# Patient Record
Sex: Female | Born: 1980 | Race: Black or African American | Hispanic: No | Marital: Single | Smoking: Never smoker
Health system: Southern US, Community
[De-identification: ages and names within clinical notes are randomized; demographics above are authoritative.]

---
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
## 2001-11-26 ENCOUNTER — Encounter: Payer: Self-pay | Admitting: Emergency Medicine

## 2001-11-26 ENCOUNTER — Emergency Department (HOSPITAL_COMMUNITY): Admission: EM | Admit: 2001-11-26 | Discharge: 2001-11-26 | Payer: Self-pay | Admitting: Emergency Medicine

## 2005-10-11 ENCOUNTER — Ambulatory Visit (HOSPITAL_COMMUNITY): Admission: RE | Admit: 2005-10-11 | Discharge: 2005-10-11 | Payer: Self-pay | Admitting: Obstetrics

## 2005-12-31 ENCOUNTER — Inpatient Hospital Stay (HOSPITAL_COMMUNITY): Admission: AD | Admit: 2005-12-31 | Discharge: 2006-01-04 | Payer: Self-pay | Admitting: Obstetrics

## 2006-01-01 ENCOUNTER — Inpatient Hospital Stay (HOSPITAL_COMMUNITY): Admission: AD | Admit: 2006-01-01 | Discharge: 2006-01-04 | Payer: Self-pay | Admitting: Obstetrics & Gynecology

## 2006-06-05 ENCOUNTER — Emergency Department (HOSPITAL_COMMUNITY): Admission: EM | Admit: 2006-06-05 | Discharge: 2006-06-05 | Payer: Self-pay | Admitting: Emergency Medicine

## 2007-11-09 ENCOUNTER — Emergency Department (HOSPITAL_COMMUNITY): Admission: EM | Admit: 2007-11-09 | Discharge: 2007-11-09 | Payer: Self-pay | Admitting: Family Medicine

## 2009-12-17 ENCOUNTER — Emergency Department (HOSPITAL_COMMUNITY): Admission: EM | Admit: 2009-12-17 | Discharge: 2009-12-17 | Payer: Self-pay | Admitting: Family Medicine

## 2009-12-19 ENCOUNTER — Emergency Department (HOSPITAL_COMMUNITY): Admission: EM | Admit: 2009-12-19 | Discharge: 2009-12-19 | Payer: Self-pay | Admitting: Emergency Medicine

## 2009-12-21 ENCOUNTER — Emergency Department (HOSPITAL_COMMUNITY): Admission: EM | Admit: 2009-12-21 | Discharge: 2009-12-21 | Payer: Self-pay | Admitting: Emergency Medicine

## 2009-12-22 ENCOUNTER — Emergency Department (HOSPITAL_COMMUNITY): Admission: EM | Admit: 2009-12-22 | Discharge: 2009-12-23 | Payer: Self-pay | Admitting: Emergency Medicine

## 2010-12-02 LAB — POCT I-STAT, CHEM 8
BUN: 15 mg/dL (ref 6–23)
BUN: 6 mg/dL (ref 6–23)
BUN: 4 mg/dL — ABNORMAL LOW (ref 6–23)
Chloride: 93 mEq/L — ABNORMAL LOW (ref 96–112)
Chloride: 97 mEq/L (ref 96–112)
Creatinine, Ser: 0.8 mg/dL (ref 0.4–1.2)
Creatinine, Ser: 0.9 mg/dL (ref 0.4–1.2)
Potassium: 3.5 mEq/L (ref 3.5–5.1)
Potassium: 3.5 mEq/L (ref 3.5–5.1)
Potassium: 3.4 mEq/L — ABNORMAL LOW (ref 3.5–5.1)
Sodium: 131 mEq/L — ABNORMAL LOW (ref 135–145)
Sodium: 138 mEq/L (ref 135–145)
Sodium: 133 mEq/L — ABNORMAL LOW (ref 135–145)

## 2010-12-02 LAB — URINALYSIS, ROUTINE W REFLEX MICROSCOPIC
Glucose, UA: NEGATIVE mg/dL
Hgb urine dipstick: NEGATIVE
Ketones, ur: 15 mg/dL — AB
Nitrite: NEGATIVE
Specific Gravity, Urine: 1.003 — ABNORMAL LOW (ref 1.005–1.030)
Specific Gravity, Urine: 1.013 (ref 1.005–1.030)
Urobilinogen, UA: 1 mg/dL (ref 0.0–1.0)
pH: 7 (ref 5.0–8.0)
pH: 7.5 (ref 5.0–8.0)

## 2010-12-02 LAB — CBC
HCT: 36.1 % (ref 36.0–46.0)
Hemoglobin: 12 g/dL (ref 12.0–15.0)
Hemoglobin: 12.2 g/dL (ref 12.0–15.0)
MCHC: 33.3 g/dL (ref 30.0–36.0)
MCV: 85.4 fL (ref 78.0–100.0)
Platelets: 317 10*3/uL (ref 150–400)
RBC: 4.22 MIL/uL (ref 3.87–5.11)
RDW: 13.2 % (ref 11.5–15.5)
RDW: 12.6 % (ref 11.5–15.5)
WBC: 7.9 10*3/uL (ref 4.0–10.5)

## 2010-12-02 LAB — DIFFERENTIAL
Basophils Absolute: 0 10*3/uL (ref 0.0–0.1)
Basophils Absolute: 0 10*3/uL (ref 0.0–0.1)
Lymphocytes Relative: 6 % — ABNORMAL LOW (ref 12–46)
Lymphocytes Relative: 38 % (ref 12–46)
Lymphs Abs: 0.5 10*3/uL — ABNORMAL LOW (ref 0.7–4.0)
Monocytes Absolute: 0.3 10*3/uL (ref 0.1–1.0)
Monocytes Absolute: 0.3 10*3/uL (ref 0.1–1.0)
Neutro Abs: 7.2 10*3/uL (ref 1.7–7.7)
Neutro Abs: 2.8 10*3/uL (ref 1.7–7.7)

## 2010-12-02 LAB — POCT URINALYSIS DIP (DEVICE)
Glucose, UA: NEGATIVE mg/dL
Hgb urine dipstick: NEGATIVE
Ketones, ur: 80 mg/dL — AB
Specific Gravity, Urine: 1.02 (ref 1.005–1.030)

## 2010-12-02 LAB — POCT PREGNANCY, URINE: Preg Test, Ur: NEGATIVE

## 2011-01-29 NOTE — Op Note (Signed)
[name], Susan Krueger           ACCOUNT NO.:  [id number]   MEDICAL RECORD NO.:  [id number]          PATIENT TYPE:  INP   LOCATION:  9102                          FACILITY:  [facility]   PHYSICIAN:  Roseanna Rainbow, M.D.DATE OF BIRTH:  [birth year]   DATE OF PROCEDURE:  01/02/2006  DATE OF DISCHARGE:                                 OPERATIVE REPORT   DELIVERY NOTE:  The patient was fully dilated and pushing.  The position was  felt to be direct OA.  The station was +4.. The vacuum was then applied,  and with one application there was crowning and subsequent delivery of the  shoulders. The delivery was effected over a midline episiotomy. The  oropharynx was suctioned with a bulb suction. Neonatology was present. The  placenta was then delivered intact with a 3-vessel cord. There was meconium  staining of the membranes. The cervix was without lacerations.  The midline  episiotomy was repaired in the usual fashion with 2-0 and 3-0 Vicryl Rapide.  The estimated blood loss was felt to be less than 500 cc.      Roseanna Rainbow, M.D.  Electronically Signed     LAJ/MEDQ  D:  01/02/2006  T:  01/03/2006  Job:  [id number]

## 2012-06-16 ENCOUNTER — Other Ambulatory Visit: Payer: Self-pay | Admitting: Infectious Diseases

## 2012-06-16 ENCOUNTER — Ambulatory Visit
Admission: RE | Admit: 2012-06-16 | Discharge: 2012-06-16 | Disposition: A | Discharge status: Home / Self Care | Payer: No Typology Code available for payment source | Source: Ambulatory Visit | Attending: Infectious Diseases | Admitting: Infectious Diseases

## 2012-06-16 DIAGNOSIS — R7612 Nonspecific reaction to cell mediated immunity measurement of gamma interferon antigen response without active tuberculosis: Secondary | ICD-10-CM

## 2013-08-07 ENCOUNTER — Emergency Department (HOSPITAL_COMMUNITY)
Admission: EM | Admit: 2013-08-07 | Discharge: 2013-08-07 | Disposition: A | Discharge status: Home / Self Care | Payer: BC Managed Care – PPO | Attending: Emergency Medicine | Admitting: Emergency Medicine

## 2013-08-07 ENCOUNTER — Encounter (HOSPITAL_COMMUNITY): Payer: Self-pay | Admitting: Emergency Medicine

## 2013-08-07 ENCOUNTER — Emergency Department (HOSPITAL_COMMUNITY): Payer: BC Managed Care – PPO

## 2013-08-07 DIAGNOSIS — Y9389 Activity, other specified: Secondary | ICD-10-CM | POA: Insufficient documentation

## 2013-08-07 DIAGNOSIS — M549 Dorsalgia, unspecified: Secondary | ICD-10-CM

## 2013-08-07 DIAGNOSIS — IMO0002 Reserved for concepts with insufficient information to code with codable children: Secondary | ICD-10-CM | POA: Insufficient documentation

## 2013-08-07 DIAGNOSIS — Y92410 Unspecified street and highway as the place of occurrence of the external cause: Secondary | ICD-10-CM | POA: Insufficient documentation

## 2013-08-07 DIAGNOSIS — Z79899 Other long term (current) drug therapy: Secondary | ICD-10-CM | POA: Insufficient documentation

## 2013-08-07 MED ORDER — IBUPROFEN 800 MG PO TABS: 800.0000 mg | ORAL_TABLET | Freq: Three times a day (TID) | ORAL | Status: DC

## 2013-08-07 MED ORDER — DIAZEPAM 5 MG PO TABS
5.0000 mg | ORAL_TABLET | Freq: Once | ORAL | Status: AC
  Administered 2013-08-07: 5 mg via ORAL
  Filled 2013-08-07: qty 1

## 2013-08-07 MED ORDER — IBUPROFEN 800 MG PO TABS
800.0000 mg | ORAL_TABLET | Freq: Once | ORAL | Status: AC
  Administered 2013-08-07: 800 mg via ORAL
  Filled 2013-08-07: qty 1
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
## 2013-08-07 NOTE — ED Provider Notes (Signed)
Medical screening examination/treatment/procedure(s) were performed by non-physician practitioner and as supervising physician I was immediately available for consultation/collaboration.  EKG Interpretation   None        Shon Baton, MD 08/07/13 1718

## 2013-08-13 ENCOUNTER — Encounter: Payer: Self-pay | Admitting: Physician Assistant

## 2013-08-13 ENCOUNTER — Ambulatory Visit (INDEPENDENT_AMBULATORY_CARE_PROVIDER_SITE_OTHER): Payer: BC Managed Care – PPO | Admitting: Physician Assistant

## 2013-08-13 VITALS — BP 112/78 | HR 84 | Temp 97.9°F | Resp 16 | Ht 67.0 in | Wt 190.0 lb

## 2013-08-13 DIAGNOSIS — N3000 Acute cystitis without hematuria: Secondary | ICD-10-CM

## 2013-08-13 DIAGNOSIS — I10 Essential (primary) hypertension: Secondary | ICD-10-CM

## 2013-08-13 DIAGNOSIS — B9689 Other specified bacterial agents as the cause of diseases classified elsewhere: Secondary | ICD-10-CM

## 2013-08-13 DIAGNOSIS — M545 Low back pain: Secondary | ICD-10-CM

## 2013-08-13 DIAGNOSIS — N760 Acute vaginitis: Secondary | ICD-10-CM

## 2013-08-13 LAB — CBC WITH DIFFERENTIAL/PLATELET
Basophils Absolute: 0 10*3/uL (ref 0.0–0.1)
Eosinophils Absolute: 0.1 10*3/uL (ref 0.0–0.7)
Lymphocytes Relative: 28 % (ref 12–46)
Lymphs Abs: 2 10*3/uL (ref 0.7–4.0)
Neutrophils Relative %: 63 % (ref 43–77)
Platelets: 436 10*3/uL — ABNORMAL HIGH (ref 150–400)
RBC: 4.57 MIL/uL (ref 3.87–5.11)
WBC: 7 10*3/uL (ref 4.0–10.5)

## 2013-08-13 LAB — BASIC METABOLIC PANEL WITH GFR
CO2: 26 mEq/L (ref 19–32)
Calcium: 9.8 mg/dL (ref 8.4–10.5)
Chloride: 102 mEq/L (ref 96–112)
GFR, Est Non African American: >89 mL/min
Sodium: 137 mEq/L (ref 135–145)

## 2013-08-13 LAB — HEPATIC FUNCTION PANEL
AST: 15 U/L (ref 0–37)
Bilirubin, Direct: 0.2 mg/dL (ref 0.0–0.3)
Total Bilirubin: 0.8 mg/dL (ref 0.3–1.2)

## 2013-08-13 MED ORDER — CYCLOBENZAPRINE HCL 10 MG PO TABS: 10.0000 mg | ORAL_TABLET | Freq: Three times a day (TID) | ORAL | Status: DC | PRN

## 2013-08-13 MED ORDER — PREDNISONE 20 MG PO TABS: ORAL_TABLET | ORAL | Status: DC

## 2013-08-13 NOTE — Patient Instructions (Signed)
Back Exercises Back exercises help treat and prevent back injuries. The goal of back exercises is to increase the strength of your abdominal and back muscles and the flexibility of your back. These exercises should be started when you no longer have back pain. Back exercises include:  Pelvic Tilt. Lie on your back with your knees bent. Tilt your pelvis until the lower part of your back is against the floor. Hold this position 5 to 10 sec and repeat 5 to 10 times.  Knee to Chest. Pull first 1 knee up against your chest and hold for 20 to 30 seconds, repeat this with the other knee, and then both knees. This may be done with the other leg straight or bent, whichever feels better.  Sit-Ups or Curl-Ups. Bend your knees 90 degrees. Start with tilting your pelvis, and do a partial, slow sit-up, lifting your trunk only 30 to 45 degrees off the floor. Take at least 2 to 3 seconds for each sit-up. Do not do sit-ups with your knees out straight. If partial sit-ups are difficult, simply do the above but with only tightening your abdominal muscles and holding it as directed.  Hip-Lift. Lie on your back with your knees flexed 90 degrees. Push down with your feet and shoulders as you raise your hips a couple inches off the floor; hold for 10 seconds, repeat 5 to 10 times.  Back arches. Lie on your stomach, propping yourself up on bent elbows. Slowly press on your hands, causing an arch in your low back. Repeat 3 to 5 times. Any initial stiffness and discomfort should lessen with repetition over time.  Shoulder-Lifts. Lie face down with arms beside your body. Keep hips and torso pressed to floor as you slowly lift your head and shoulders off the floor. Do not overdo your exercises, especially in the beginning. Exercises may cause you some mild back discomfort which lasts for a few minutes; however, if the pain is more severe, or lasts for more than 15 minutes, do not continue exercises until you see your caregiver.  Improvement with exercise therapy for back problems is slow.  See your caregivers for assistance with developing a proper back exercise program. Document Released: 10/07/2004 Document Revised: 11/22/2011 Document Reviewed: 07/01/2011 [employer] Patient Information 2014 [employer].  
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
## 2013-08-14 LAB — URINALYSIS, ROUTINE W REFLEX MICROSCOPIC
Glucose, UA: NEGATIVE mg/dL
Leukocytes, UA: NEGATIVE
Specific Gravity, Urine: 1.025 (ref 1.005–1.030)
pH: 6 (ref 5.0–8.0)

## 2013-08-14 LAB — URINE CULTURE: Colony Count: NO GROWTH

## 2013-10-25 ENCOUNTER — Ambulatory Visit (INDEPENDENT_AMBULATORY_CARE_PROVIDER_SITE_OTHER): Payer: BC Managed Care – PPO | Admitting: Physician Assistant

## 2013-10-25 ENCOUNTER — Encounter: Payer: Self-pay | Admitting: Physician Assistant

## 2013-10-25 VITALS — BP 102/62 | HR 80 | Temp 97.9°F | Resp 16 | Wt 207.0 lb

## 2013-10-25 DIAGNOSIS — N3000 Acute cystitis without hematuria: Secondary | ICD-10-CM

## 2013-10-25 DIAGNOSIS — M545 Low back pain, unspecified: Secondary | ICD-10-CM

## 2013-10-25 MED ORDER — CYCLOBENZAPRINE HCL 10 MG PO TABS: 10.0000 mg | ORAL_TABLET | Freq: Three times a day (TID) | ORAL | Status: AC | PRN

## 2013-10-25 MED ORDER — MELOXICAM 15 MG PO TABS: ORAL_TABLET | ORAL | Status: DC

## 2013-10-25 NOTE — Patient Instructions (Signed)
Back Pain, Adult °Low back pain is very common. About 1 in 5 people have back pain. The cause of low back pain is rarely dangerous. The pain often gets better over time. About half of people with a sudden onset of back pain feel better in just 2 weeks. About 8 in 10 people feel better by 6 weeks.  °CAUSES °Some common causes of back pain include: °· Strain of the muscles or ligaments supporting the spine. °· Wear and tear (degeneration) of the spinal discs. °· Arthritis. °· Direct injury to the back. °DIAGNOSIS °Most of the time, the direct cause of low back pain is not known. However, back pain can be treated effectively even when the exact cause of the pain is unknown. Answering your caregiver's questions about your overall health and symptoms is one of the most accurate ways to make sure the cause of your pain is not dangerous. If your caregiver needs more information, he or she may order lab work or imaging tests (X-rays or MRIs). However, even if imaging tests show changes in your back, this usually does not require surgery. °HOME CARE INSTRUCTIONS °For many people, back pain returns. Since low back pain is rarely dangerous, it is often a condition that people can learn to manage on their own.  °· Remain active. It is stressful on the back to sit or stand in one place. Do not sit, drive, or stand in one place for more than 30 minutes at a time. Take short walks on level surfaces as soon as pain allows. Try to increase the length of time you walk each day. °· Do not stay in bed. Resting more than 1 or 2 days can delay your recovery. °· Do not avoid exercise or work. Your body is made to move. It is not dangerous to be active, even though your back may hurt. Your back will likely heal faster if you return to being active before your pain is gone. °· Pay attention to your body when you  bend and lift. Many people have less discomfort when lifting if they bend their knees, keep the load close to their bodies, and  avoid twisting. Often, the most comfortable positions are those that put less stress on your recovering back. °· Find a comfortable position to sleep. Use a firm mattress and lie on your side with your knees slightly bent. If you lie on your back, put a pillow under your knees. °· Only take over-the-counter or prescription medicines as directed by your caregiver. Over-the-counter medicines to reduce pain and inflammation are often the most helpful. Your caregiver may prescribe muscle relaxant drugs. These medicines help dull your pain so you can more quickly return to your normal activities and healthy exercise. °· Put ice on the injured area. °· Put ice in a plastic bag. °· Place a towel between your skin and the bag. °· Leave the ice on for 15-20 minutes, 03-04 times a day for the first 2 to 3 days. After that, ice and heat may be alternated to reduce pain and spasms. °· Ask your caregiver about trying back exercises and gentle massage. This may be of some benefit. °· Avoid feeling anxious or stressed. Stress increases muscle tension and can worsen back pain. It is important to recognize when you are anxious or stressed and learn ways to manage it. Exercise is a great option. °SEEK MEDICAL CARE IF: °· You have pain that is not relieved with rest or medicine. °· You have pain that does not improve in 1 week. °· You have new symptoms. °· You are generally not feeling well. °SEEK   IMMEDIATE MEDICAL CARE IF:   You have pain that radiates from your back into your legs.  You develop new bowel or bladder control problems.  You have unusual weakness or numbness in your arms or legs.  You develop nausea or vomiting.  You develop abdominal pain.  You feel faint. Document Released: 08/30/2005 Document Revised: 02/29/2012 Document Reviewed: 01/18/2011 [facility] Patient Information 2014 [place], [place]. Back Injury Prevention Back injuries can be extremely painful and difficult to heal. After having one back  injury, you are much more likely to experience another later on. It is important to learn how to avoid injuring or re-injuring your back. The following tips can help you to prevent a back injury. PHYSICAL FITNESS  Exercise regularly and try to develop good tone in your abdominal muscles. Your abdominal muscles provide a lot of the support needed by your back.  Do aerobic exercises (walking, jogging, biking, swimming) regularly.  Do exercises that increase balance and strength (tai chi, yoga) regularly. This can decrease your risk of falling and injuring your back.  Stretch before and after exercising.  Maintain a healthy weight. The more you weigh, the more stress is placed on your back. For every pound of weight, 10 times that amount of pressure is placed on the back. DIET  Talk to your caregiver about how much calcium and vitamin D you need per day. These nutrients help to prevent weakening of the bones (osteoporosis). Osteoporosis can cause broken (fractured) bones that lead to back pain.  Include good sources of calcium in your diet, such as dairy products, green, leafy vegetables, and products with calcium added (fortified).  Include good sources of vitamin D in your diet, such as milk and foods that are fortified with vitamin D.  Consider taking a nutritional supplement or a multivitamin if needed.  Stop smoking if you smoke. POSTURE  Sit and stand up straight. Avoid leaning forward when you sit or hunching over when you stand.  Choose chairs with good low back (lumbar) support.  If you work at a desk, sit close to your work so you do not need to lean over. Keep your chin tucked in. Keep your neck drawn back and elbows bent at a right angle. Your arms should look like the letter "L."  Sit high and close to the steering wheel when you drive. Add a lumbar support to your car seat if needed.  Avoid sitting or standing in one position for too long. Take breaks to get up, stretch,  and walk around at least once every hour. Take breaks if you are driving for long periods of time.  Sleep on your side with your knees slightly bent, or sleep on your back with a pillow under your knees. Do not sleep on your stomach. LIFTING, TWISTING, AND REACHING  Avoid heavy lifting, especially repetitive lifting. If you must do heavy lifting:  Stretch before lifting.  Work slowly.  Rest between lifts.  Use carts and dollies to move objects when possible.  Make several small trips instead of carrying 1 heavy load.  Ask for help when you need it.  Ask for help when moving big, awkward objects.  Follow these steps when lifting:  Stand with your feet shoulder-width apart.  Get as close to the object as you can. Do not try to pick up heavy objects that are far from your body.  Use handles or lifting straps if they are available.  Bend at your knees. Squat down, but keep your  heels off the floor.  Keep your shoulders pulled back, your chin tucked in, and your back straight.  Lift the object slowly, tightening the muscles in your legs, abdomen, and buttocks. Keep the object as close to the center of your body as possible.  When you put a load down, use these same guidelines in reverse.  Do not:  Lift the object above your waist.  Twist at the waist while lifting or carrying a load. Move your feet if you need to turn, not your waist.  Bend over without bending at your knees.  Avoid reaching over your head, across a table, or for an object on a high surface. OTHER TIPS  Avoid wet floors and keep sidewalks clear of ice to prevent falls.  Do not sleep on a mattress that is too soft or too hard.  Keep items that are used frequently within easy reach.  Put heavier objects on shelves at waist level and lighter objects on lower or higher shelves.  Find ways to decrease your stress, such as exercise, massage, or relaxation techniques. Stress can build up in your muscles.  Tense muscles are more vulnerable to injury.  Seek treatment for depression or anxiety if needed. These conditions can increase your risk of developing back pain. SEEK MEDICAL CARE IF:  You injure your back.  You have questions about diet, exercise, or other ways to prevent back injuries. MAKE SURE YOU:  Understand these instructions.  Will watch your condition.  Will get help right away if you are not doing well or get worse. Document Released: 10/07/2004 Document Revised: 11/22/2011 Document Reviewed: 10/11/2011 [facility] Patient Information 2014 [place], [place].
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
## 2013-10-26 LAB — URINALYSIS, ROUTINE W REFLEX MICROSCOPIC
BILIRUBIN URINE: NEGATIVE
Glucose, UA: NEGATIVE mg/dL
Hgb urine dipstick: NEGATIVE
KETONES UR: NEGATIVE mg/dL
Nitrite: NEGATIVE
PROTEIN: NEGATIVE mg/dL
Specific Gravity, Urine: 1.02 (ref 1.005–1.030)
UROBILINOGEN UA: 0.2 mg/dL (ref 0.0–1.0)
pH: 6 (ref 5.0–8.0)

## 2013-10-26 LAB — URINALYSIS, MICROSCOPIC ONLY
BACTERIA UA: NONE SEEN
CASTS: NONE SEEN
Crystals: NONE SEEN

## 2013-10-27 LAB — URINE CULTURE
Colony Count: NO GROWTH
Organism ID, Bacteria: NO GROWTH

## 2013-11-20 ENCOUNTER — Encounter: Payer: Self-pay | Admitting: Physician Assistant

## 2014-04-29 ENCOUNTER — Emergency Department (INDEPENDENT_AMBULATORY_CARE_PROVIDER_SITE_OTHER)
Admission: EM | Admit: 2014-04-29 | Discharge: 2014-04-29 | Disposition: A | Discharge status: Home / Self Care | Payer: Self-pay | Source: Home / Self Care

## 2014-04-29 ENCOUNTER — Encounter (HOSPITAL_COMMUNITY): Payer: Self-pay | Admitting: Emergency Medicine

## 2014-04-29 DIAGNOSIS — W57XXXA Bitten or stung by nonvenomous insect and other nonvenomous arthropods, initial encounter: Secondary | ICD-10-CM

## 2014-04-29 DIAGNOSIS — T148 Other injury of unspecified body region: Secondary | ICD-10-CM

## 2014-04-29 MED ORDER — TRIAMCINOLONE ACETONIDE 0.1 % EX CREA: 1.0000 "application " | TOPICAL_CREAM | Freq: Two times a day (BID) | CUTANEOUS | Status: DC

## 2014-04-29 NOTE — Discharge Instructions (Signed)
Bedbugs °Bedbugs are tiny bugs that live in and around beds. During the day, they hide in mattresses and other places near beds. They come out at night and bite people lying in bed. They need blood to live and grow. Bedbugs can be found in beds anywhere. Usually, they are found in places where many people come and go (hotels, shelters, hospitals). It does not matter whether the place is dirty or clean. °Getting bitten by bedbugs rarely causes a medical problem. The biggest problem can be getting rid of them.  This often takes the work of a pest control expert. °CAUSES °· Less use of pesticides. Bedbugs were common before the 1950s. Then, strong pesticides such as DDT nearly wiped them out. Today, these pesticides are not used because they harm the environment and can cause health problems. °· More travel. Besides mattresses, bedbugs can also live in clothing and luggage. They can come along as people travel from place to place. Bedbugs are more common in certain parts of the world. When people travel to those areas, the bugs can come home with them. °· Presence of birds and bats. Bedbugs often infest birds and bats. If you have these animals in or near your home, bedbugs may infest your house, too. °SYMPTOMS °It does not hurt to be bitten by a bedbug. You will probably not wake up when you are bitten. Bedbugs usually bite areas of the skin that are not covered. Symptoms may show when you wake up, or they may take a day or more to show up. Symptoms may include: °· Small red bumps on the skin. These might be lined up in a row or clustered in a group. °· A darker red dot in the middle of red bumps. °· Blisters on the skin. There may be swelling and very bad itching. These may be signs of an allergic reaction. This does not happen often. °DIAGNOSIS °Bedbug bites might look and feel like other types of insect bites. The bugs do not stay on the body like ticks or lice. They bite, drop off, and crawl away to hide. Your  caregiver will probably: °· Ask about your symptoms. °· Ask about your recent activities and travel. °· Check your skin for bedbug bites. °· Ask you to check at home for signs of bedbugs. You should look for: °¨ Spots or stains on the bed or nearby. This could be from bedbugs that were crushed or from their eggs or waste. °¨ Bedbugs themselves. They are reddish-brown, oval, and flat. They do not fly. They are about the size of an apple seed. °· Places to look for bedbugs include: °¨ Beds. Check mattresses, headboards, box springs, and bed frames. °¨ On drapes and curtains near the bed. °¨ Under carpeting in the bedroom. °¨ Behind electrical outlets. °¨ Behind any wallpaper that is peeling. °¨ Inside luggage. °TREATMENT °Most bedbug bites do not need treatment. They usually go away on their own in a few days. The bites are not dangerous. However, treatment may be needed if you have scratched so much that your skin has become infected. You may also need treatment if you are allergic to bedbug bites. Treatment options include: °· A drug that stops swelling and itching (corticosteroid). Usually, a cream is rubbed on the skin. If you have a bad rash, you may be given a corticosteroid pill. °· Oral antihistamines. These are pills to help control itching. °· Antibiotic medicines. An antibiotic may be prescribed for infected skin. °HOME CARE INSTRUCTIONS  °·   Take any medicine prescribed by your caregiver for your bites. Follow the directions carefully. °· Consider wearing pajamas with long sleeves and pant legs. °· Your bedroom may need to be treated. A pest control expert should make sure the bedbugs are gone. You may need to throw away mattresses or luggage. Ask the pest control expert what you can do to keep the bedbugs from coming back. Common suggestions include: °¨ Putting a plastic cover over your mattress. °¨ Washing and drying your clothes and bedding in hot water and a hot dryer. The temperature should be hotter  than 120° F (48.9° C). Bedbugs are killed by high temperatures. °¨ Vacuuming carefully all around your bed. Vacuum in all cracks and crevices where the bugs might hide. Do this often. °¨ Carefully checking all used furniture, bedding, or clothes that you bring into your house. °¨ Eliminating bird nests and bat roosts. °· If you get bedbug bites when traveling, check all your possessions carefully before bringing them into your house. If you find any bugs on clothes or in your luggage, consider throwing those items away. °SEEK MEDICAL CARE IF: °· You have red bug bites that keep coming back. °· You have red bug bites that itch badly. °· You have bug bites that cause a skin rash. °· You have scratch marks that are red and sore. °SEEK IMMEDIATE MEDICAL CARE IF: °You have a fever. °Document Released: 10/02/2010 Document Revised: 11/22/2011 Document Reviewed: 10/02/2010 °ExitCare® Patient Information ©2015 [employer]. This information is not intended to replace advice given to you by your health care provider. Make sure you discuss any questions you have with your health care provider. °Insect Bite °Mosquitoes, flies, fleas, bedbugs, and many other insects can bite. Insect bites are different from insect stings. A sting is when venom is injected into the skin. Some insect bites can transmit infectious diseases. °SYMPTOMS  °Insect bites usually turn red, swell, and itch for 2 to 4 days. They often go away on their own. °TREATMENT  °Your caregiver may prescribe antibiotic medicines if a bacterial infection develops in the bite. °HOME CARE INSTRUCTIONS °· Do not scratch the bite area. °· Keep the bite area clean and dry. Wash the bite area thoroughly with soap and water. °· Put ice or cool compresses on the bite area. °¨ Put ice in a plastic bag. °¨ Place a towel between your skin and the bag. °¨ Leave the ice on for 20 minutes, 4 times a day for the first 2 to 3 days, or as directed. °· You may apply a baking soda  paste, cortisone cream, or calamine lotion to the bite area as directed by your caregiver. This can help reduce itching and swelling. °· Only take over-the-counter or prescription medicines as directed by your caregiver. °· If you are given antibiotics, take them as directed. Finish them even if you start to feel better. °You may need a tetanus shot if: °· You cannot remember when you had your last tetanus shot. °· You have never had a tetanus shot. °· The injury broke your skin. °If you get a tetanus shot, your arm may swell, get red, and feel warm to the touch. This is common and not a problem. If you need a tetanus shot and you choose not to have one, there is a rare chance of getting tetanus. Sickness from tetanus can be serious. °SEEK IMMEDIATE MEDICAL CARE IF:  °· You have increased pain, redness, or swelling in the bite area. °· You   see a red line on the skin coming from the bite. °· You have a fever. °· You have joint pain. °· You have a headache or neck pain. °· You have unusual weakness. °· You have a rash. °· You have chest pain or shortness of breath. °· You have abdominal pain, nausea, or vomiting. °· You feel unusually tired or sleepy. °MAKE SURE YOU:  °· Understand these instructions. °· Will watch your condition. °· Will get help right away if you are not doing well or get worse. °Document Released: 10/07/2004 Document Revised: 11/22/2011 Document Reviewed: 03/31/2011 °ExitCare® Patient Information ©2015 [employer]. This information is not intended to replace advice given to you by your health care provider. Make sure you discuss any questions you have with your health care provider. ° °

## 2014-04-29 NOTE — ED Provider Notes (Signed)
CSN: [id number]     Arrival date & time 04/29/14  1047 History   First MD Initiated Contact with Patient 04/29/14 1100     No chief complaint on file.  (Consider location/radiation/quality/duration/timing/severity/associated sxs/prior Treatment) HPI Comments: 33 year old female staying at a friend's house for a few days noticed a small red itchy bumps primarily to her right arm approximately 5 days ago. Each day she seems to get anyone. Several levels are in a linear pattern. Denies systemic symptoms or illness.   History reviewed. No pertinent past medical history. History reviewed. No pertinent past surgical history. Family History  Problem Relation Age of Onset  . Hypertension Mother   . Hypertension Father   . Deep vein thrombosis Sister     after second son  . Asthma Daughter    History  Substance Use Topics  . Smoking status: Never Smoker   . Smokeless tobacco: Never Used  . Alcohol Use: Yes     Comment: socially   OB History   Grav Para Term Preterm Abortions TAB SAB Ect Mult Living                 Review of Systems  Constitutional: Negative.   HENT: Negative.   Respiratory: Negative.   Gastrointestinal: Negative.   Genitourinary: Negative.   Skin: Positive for rash.  Neurological: Negative.     Allergies  Amoxicillin  Home Medications   Prior to Admission medications   Medication Sig Start Date End Date Taking? Authorizing Provider  cyclobenzaprine (FLEXERIL) 10 MG tablet Take 1 tablet (10 mg total) by mouth 3 (three) times daily as needed for muscle spasms. 10/25/13 10/25/14  Quentin Mulling, PA-C  ibuprofen (ADVIL,MOTRIN) 800 MG tablet Take 1 tablet (800 mg total) by mouth 3 (three) times daily. 08/07/13   Junius Finner, PA-C  meloxicam (MOBIC) 15 MG tablet Take 1 pill daily with food for pain 10/25/13   Quentin Mulling, PA-C  OVER THE COUNTER MEDICATION Take 1 Units by mouth daily. Herbalife tea/ shake    Historical Provider, MD  predniSONE (DELTASONE) 20 MG  tablet Take one tablet three times daily with food for 3 days, take one tablet two times daily with food for 3 days, take one tablet daily for 5 days. 08/13/13   Quentin Mulling, PA-C  triamcinolone cream (KENALOG) 0.1 % Apply 1 application topically 2 (two) times daily. 04/29/14   Hayden Rasmussen, NP   BP 124/87  Pulse 80  Temp(Src) 97.6 F (36.4 C) (Oral)  Resp 16  SpO2 98% Physical Exam  Nursing note and vitals reviewed. Constitutional: She is oriented to person, place, and time. She appears well-developed and well-nourished. No distress.  Eyes: EOM are normal.  Neck: Normal range of motion. Neck supple.  Cardiovascular: Normal rate.   Pulmonary/Chest: Effort normal. No respiratory distress.  Musculoskeletal: She exhibits no edema and no tenderness.  Neurological: She is alert and oriented to person, place, and time. She exhibits normal muscle tone.  Skin: Skin is warm and dry.  There are several 4-5 mm annular and rounded red lesions to the right arm. Several of these occur in a linear pattern. There also similar lesions to the upper back and shoulders. Some areas have underlying induration. No lymphangitis or evidence of infection. No drainage, no pustules or discharge.  Psychiatric: She has a normal mood and affect.    ED Course  Procedures (including critical care time) Labs Review Labs Reviewed - No data to display  Imaging Review No results found.  MDM   1. Multiple insect bites    Triamcinolone cream as dir Use Rid to mattress, Clean all bedding and bed clothes. Read instructions     Hayden Rasmussenavid Arel Tippen, NP 04/29/14 1114

## 2014-05-01 NOTE — ED Provider Notes (Signed)
Medical screening examination/treatment/procedure(s) were performed by non-physician practitioner and as supervising physician I was immediately available for consultation/collaboration.  Rexann Lueras, M.D.  Joandry Slagter C Jasyn Mey, MD 05/01/14 0759 

## 2014-11-21 ENCOUNTER — Encounter: Payer: Self-pay | Admitting: Physician Assistant
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
## 2023-11-18 ENCOUNTER — Encounter (HOSPITAL_COMMUNITY): Payer: Self-pay

## 2023-11-18 ENCOUNTER — Emergency Department (HOSPITAL_COMMUNITY)

## 2023-11-18 ENCOUNTER — Emergency Department (HOSPITAL_COMMUNITY)
Admission: EM | Admit: 2023-11-18 | Discharge: 2023-11-18 | Disposition: A | Discharge status: Home / Self Care | Attending: Emergency Medicine | Admitting: Emergency Medicine

## 2023-11-18 ENCOUNTER — Other Ambulatory Visit: Payer: Self-pay

## 2023-11-18 DIAGNOSIS — M546 Pain in thoracic spine: Secondary | ICD-10-CM | POA: Insufficient documentation

## 2023-11-18 DIAGNOSIS — M545 Low back pain, unspecified: Secondary | ICD-10-CM | POA: Insufficient documentation

## 2023-11-18 DIAGNOSIS — Y92481 Parking lot as the place of occurrence of the external cause: Secondary | ICD-10-CM | POA: Diagnosis not present

## 2023-11-18 MED ORDER — KETOROLAC TROMETHAMINE 15 MG/ML IJ SOLN
15.0000 mg | Freq: Once | INTRAMUSCULAR | Status: AC
  Administered 2023-11-18: 15 mg via INTRAMUSCULAR
  Filled 2023-11-18: qty 1

## 2023-11-18 MED ORDER — METHOCARBAMOL 500 MG PO TABS: 500.0000 mg | ORAL_TABLET | Freq: Two times a day (BID) | ORAL | 0 refills | Status: AC

## 2023-11-18 MED ORDER — NAPROXEN 500 MG PO TABS: 500.0000 mg | ORAL_TABLET | Freq: Two times a day (BID) | ORAL | 0 refills | Status: AC

## 2023-11-18 NOTE — ED Notes (Signed)
 EMS C-collar in place.

## 2023-11-18 NOTE — ED Notes (Signed)
 Patient transported to X-ray
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
## 2023-11-21 ENCOUNTER — Encounter (HOSPITAL_COMMUNITY): Payer: Self-pay

## 2023-11-21 ENCOUNTER — Ambulatory Visit (INDEPENDENT_AMBULATORY_CARE_PROVIDER_SITE_OTHER)

## 2023-11-21 ENCOUNTER — Ambulatory Visit (HOSPITAL_COMMUNITY): Admission: EM | Admit: 2023-11-21 | Discharge: 2023-11-21 | Disposition: A | Discharge status: Home / Self Care

## 2023-11-21 DIAGNOSIS — M7918 Myalgia, other site: Secondary | ICD-10-CM

## 2023-11-21 DIAGNOSIS — M25562 Pain in left knee: Secondary | ICD-10-CM
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
